# Patient Record
Sex: Female | Born: 1990 | Race: Black or African American | Hispanic: No | Marital: Single | Smoking: Never smoker
Health system: Southern US, Community
[De-identification: ages and names within clinical notes are randomized; demographics above are authoritative.]

## PROBLEM LIST (undated history)

## (undated) ENCOUNTER — Inpatient Hospital Stay (HOSPITAL_COMMUNITY): Payer: Self-pay

## (undated) HISTORY — PX: TONSILLECTOMY: SUR1361

---

## 2013-02-24 ENCOUNTER — Encounter (HOSPITAL_COMMUNITY): Payer: Self-pay

## 2013-02-24 DIAGNOSIS — R1011 Right upper quadrant pain: Secondary | ICD-10-CM | POA: Insufficient documentation

## 2013-02-24 DIAGNOSIS — Z79899 Other long term (current) drug therapy: Secondary | ICD-10-CM | POA: Insufficient documentation

## 2013-02-24 DIAGNOSIS — Z3202 Encounter for pregnancy test, result negative: Secondary | ICD-10-CM | POA: Insufficient documentation

## 2013-02-24 DIAGNOSIS — E876 Hypokalemia: Secondary | ICD-10-CM | POA: Insufficient documentation

## 2013-02-24 DIAGNOSIS — Z87891 Personal history of nicotine dependence: Secondary | ICD-10-CM | POA: Insufficient documentation

## 2013-02-24 LAB — COMPREHENSIVE METABOLIC PANEL
AST: 15 U/L (ref 0–37)
Albumin: 3.7 g/dL (ref 3.5–5.2)
BUN: 10 mg/dL (ref 6–23)
Calcium: 9.2 mg/dL (ref 8.4–10.5)
Chloride: 100 mEq/L (ref 96–112)
Creatinine, Ser: 0.8 mg/dL (ref 0.50–1.10)
Total Bilirubin: 0.2 mg/dL — ABNORMAL LOW (ref 0.3–1.2)

## 2013-02-24 LAB — CBC WITH DIFFERENTIAL/PLATELET
Basophils Absolute: 0 10*3/uL (ref 0.0–0.1)
Basophils Relative: 1 % (ref 0–1)
Eosinophils Absolute: 0.1 10*3/uL (ref 0.0–0.7)
Eosinophils Relative: 1 % (ref 0–5)
HCT: 41.7 % (ref 36.0–46.0)
Hemoglobin: 13.9 g/dL (ref 12.0–15.0)
MCH: 28.4 pg (ref 26.0–34.0)
MCHC: 33.3 g/dL (ref 30.0–36.0)
MCV: 85.1 fL (ref 78.0–100.0)
Monocytes Absolute: 0.8 10*3/uL (ref 0.1–1.0)
Monocytes Relative: 10 % (ref 3–12)
Neutro Abs: 4.5 10*3/uL (ref 1.7–7.7)
RDW: 13 % (ref 11.5–15.5)

## 2013-02-24 LAB — URINALYSIS, ROUTINE W REFLEX MICROSCOPIC
Ketones, ur: NEGATIVE mg/dL
Leukocytes, UA: NEGATIVE
Nitrite: NEGATIVE
Protein, ur: NEGATIVE mg/dL
pH: 5 (ref 5.0–8.0)

## 2013-02-24 LAB — POCT PREGNANCY, URINE: Preg Test, Ur: NEGATIVE

## 2013-02-24 NOTE — ED Notes (Signed)
NURSE FIRST ROUNDS :  NURSE EXPLAINED DELAY , WAIT TIME AND PROCESS , RESPIRATIONS UNLABORED , AMBULATORY ,  VITAL SIGNS CHECKED.

## 2013-02-24 NOTE — ED Notes (Signed)
Pt c/o RUQ pain x1 week. Pt denies N/V/D, abnormal vaginal odor or discharge, or burning w/urination

## 2013-02-25 ENCOUNTER — Encounter (HOSPITAL_COMMUNITY): Payer: Self-pay

## 2013-02-25 ENCOUNTER — Emergency Department (HOSPITAL_COMMUNITY): Payer: BC Managed Care – PPO

## 2013-02-25 ENCOUNTER — Emergency Department (HOSPITAL_COMMUNITY)
Admission: EM | Admit: 2013-02-25 | Discharge: 2013-02-25 | Disposition: A | Discharge status: Home / Self Care | Payer: BC Managed Care – PPO | Attending: Emergency Medicine | Admitting: Emergency Medicine

## 2013-02-25 DIAGNOSIS — R1011 Right upper quadrant pain: Secondary | ICD-10-CM

## 2013-02-25 DIAGNOSIS — E876 Hypokalemia: Secondary | ICD-10-CM

## 2013-02-25 LAB — LIPASE, BLOOD: Lipase: 24 U/L (ref 11–59)

## 2013-02-25 MED ORDER — KETOROLAC TROMETHAMINE 30 MG/ML IJ SOLN
30.0000 mg | Freq: Once | INTRAMUSCULAR | Status: AC
  Administered 2013-02-25: 30 mg via INTRAVENOUS
  Filled 2013-02-25: qty 1

## 2013-02-25 MED ORDER — IOHEXOL 350 MG/ML SOLN
100.0000 mL | Freq: Once | INTRAVENOUS | Status: AC | PRN
  Administered 2013-02-25: 100 mL via INTRAVENOUS

## 2013-02-25 MED ORDER — SODIUM CHLORIDE 0.9 % IV SOLN
INTRAVENOUS | Status: DC
  Administered 2013-02-25: 06:00:00 via INTRAVENOUS

## 2013-02-25 MED ORDER — ONDANSETRON 4 MG PO TBDP
8.0000 mg | ORAL_TABLET | Freq: Once | ORAL | Status: AC
  Administered 2013-02-25: 8 mg via ORAL
  Filled 2013-02-25: qty 2

## 2013-02-25 MED ORDER — MORPHINE SULFATE 4 MG/ML IJ SOLN
4.0000 mg | Freq: Once | INTRAMUSCULAR | Status: AC
  Administered 2013-02-25: 4 mg via INTRAMUSCULAR
  Filled 2013-02-25: qty 1

## 2013-02-25 MED ORDER — ONDANSETRON 4 MG PO TBDP: 4.0000 mg | ORAL_TABLET | Freq: Three times a day (TID) | ORAL | Status: DC | PRN

## 2013-02-25 MED ORDER — POTASSIUM CHLORIDE ER 20 MEQ PO TBCR: 20.0000 meq | EXTENDED_RELEASE_TABLET | Freq: Two times a day (BID) | ORAL | Status: DC

## 2013-02-25 MED ORDER — HYDROCODONE-ACETAMINOPHEN 5-325 MG PO TABS: ORAL_TABLET | ORAL | Status: DC

## 2013-02-25 NOTE — ED Provider Notes (Signed)
History     CSN: [id number]  Arrival date & time 02/24/13  2101   First MD Initiated Contact with Patient 02/25/13 [phone]      Chief Complaint  Patient presents with  . Abdominal Pain    (Consider location/radiation/quality/duration/timing/severity/associated sxs/prior treatment) HPI Patient reports she's had some right upper quadrant pain off and on for over a week. She describes it as a sharp pain. She does not related to food. She states it does not radiate. She denies nausea, vomiting, diarrhea, fever, cough, or shortness of breath. She states that when she has the pain coughing or deep breathing makes it worse. She states the pain only lasted a few minutes and it occurs 2-3 times a day. She states it's worse at night. She denies any urinary symptoms such as dysuria or frequency. She states when she was in high school she was evaluated for gallstones that was negative. She does not recall what that pain was like. She relates today her pain started at 9 PM and continues. She relates her pain was a 10 out of 10 and currently is a 7/10.  PCP out-of-town  History reviewed. No pertinent past medical history.  History reviewed. No pertinent past surgical history.  History reviewed. No pertinent family history.  History  Substance Use Topics  . Smoking status: Former Games developer  . Smokeless tobacco: Not on file  . Alcohol Use: No   college student Occasional alcohol  OB History   Grav Para Term Preterm Abortions TAB SAB Ect Mult Living                  Review of Systems  All other systems reviewed and are negative.    Allergies  Review of patient's allergies indicates no known allergies.  Home Medications   Current Outpatient Rx  Name  Route  Sig  Dispense  Refill  . HYDROcodone-acetaminophen (NORCO/VICODIN) 5-325 MG per tablet      Take 1 or 2 po Q 6hrs for pain   10 tablet   0   . ondansetron (ZOFRAN ODT) 4 MG disintegrating tablet   Oral   Take 1 tablet (4 mg  total) by mouth every 8 (eight) hours as needed for nausea.   6 tablet   0   . potassium chloride 20 MEQ TBCR   Oral   Take 20 mEq by mouth 2 (two) times daily.   8 tablet   0    Implanon  BP 111/75  Pulse 78  Temp(Src) 99.2 F (37.3 C) (Oral)  Resp 16  SpO2 98%  LMP 02/10/2013  Vital signs normal    Physical Exam  Nursing note and vitals reviewed. Constitutional: She is oriented to person, place, and time. She appears well-developed and well-nourished.  Non-toxic appearance. She does not appear ill. No distress.  HENT:  Head: Normocephalic and atraumatic.  Right Ear: External ear normal.  Left Ear: External ear normal.  Nose: Nose normal. No mucosal edema or rhinorrhea.  Mouth/Throat: Oropharynx is clear and moist and mucous membranes are normal. No dental abscesses or edematous.  Eyes: Conjunctivae and EOM are normal. Pupils are equal, round, and reactive to light.  Neck: Normal range of motion and full passive range of motion without pain. Neck supple.  Cardiovascular: Normal rate, regular rhythm and normal heart sounds.  Exam reveals no gallop and no friction rub.   No murmur heard. Pulmonary/Chest: Effort normal and breath sounds normal. No respiratory distress. She has no wheezes. She has  no rhonchi. She has no rales. She exhibits no tenderness and no crepitus.  Abdominal: Soft. Normal appearance and bowel sounds are normal. She exhibits no distension. There is tenderness. There is no rebound and no guarding.    Musculoskeletal: Normal range of motion. She exhibits no edema and no tenderness.  Moves all extremities well.   Neurological: She is alert and oriented to person, place, and time. She has normal strength. No cranial nerve deficit.  Skin: Skin is warm, dry and intact. No rash noted. No erythema. No pallor.  Psychiatric: She has a normal mood and affect. Her speech is normal and behavior is normal. Her mood appears not anxious.    ED Course  Procedures  (including critical care time)  Medications  0.9 %  sodium chloride infusion ( Intravenous New Bag/Given 02/25/13 0608)  morphine 4 MG/ML injection 4 mg (4 mg Intramuscular Given 02/25/13 0158)  ondansetron (ZOFRAN-ODT) disintegrating tablet 8 mg (8 mg Oral Given 02/25/13 0157)  ketorolac (TORADOL) 30 MG/ML injection 30 mg (30 mg Intravenous Given 02/25/13 0608)  iohexol (OMNIPAQUE) 350 MG/ML injection 100 mL (100 mLs Intravenous Contrast Given 02/25/13 0634)   Patient's pain improved during her ED visit. She did tell the nurse she was having some lower abdominal pain but that was only when she had to urinate. She states that pain is gone. Patient's pain is classic for gallbladder however her ultrasound is normal. We discussed she needed to have further evaluation such as a HIDA scan however she has no local doctor to followup with. 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  Results for orders placed during the hospital encounter of 02/25/13  CBC WITH DIFFERENTIAL      Result Value Range   WBC 7.7  4.0 - 10.5 K/uL   RBC 4.90  3.87 - 5.11 MIL/uL   Hemoglobin 13.9  12.0 - 15.0 g/dL   HCT 40.9  81.1 - 91.4 %   MCV 85.1  78.0 - 100.0 fL   MCH 28.4  26.0 - 34.0 pg   MCHC 33.3  30.0 - 36.0 g/dL   RDW 78.2  95.6 - 21.3 %   Platelets 324  150 - 400 K/uL   Neutrophils Relative % 58  43 - 77 %   Neutro Abs 4.5  1.7 - 7.7 K/uL   Lymphocytes Relative 31  12 - 46 %   Lymphs Abs 2.4  0.7 - 4.0 K/uL   Monocytes Relative 10  3 - 12 %   Monocytes Absolute 0.8  0.1 - 1.0 K/uL   Eosinophils Relative 1  0 - 5 %   Eosinophils Absolute 0.1  0.0 - 0.7 K/uL   Basophils Relative 1  0 - 1 %   Basophils Absolute 0.0  0.0 - 0.1 K/uL  COMPREHENSIVE METABOLIC PANEL      Result Value Range   Sodium 136  135 - 145 mEq/L   Potassium 3.2 (*) 3.5 - 5.1 mEq/L   Chloride 100  96 - 112 mEq/L   CO2 26  19 - 32 mEq/L   Glucose, Bld 67 (*) 70 - 99 mg/dL   BUN 10  6 - 23  mg/dL   Creatinine, Ser 0.86  0.50 - 1.10 mg/dL   Calcium 9.2  8.4 - 57.8 mg/dL   Total Protein 7.6  6.0 - 8.3 g/dL   Albumin 3.7  3.5 - 5.2 g/dL   AST 15  0 -  37 U/L   ALT 6  0 - 35 U/L   Alkaline Phosphatase 49  39 - 117 U/L   Total Bilirubin 0.2 (*) 0.3 - 1.2 mg/dL   GFR calc non Af Amer >90  >90 mL/min   GFR calc Af Amer >90  >90 mL/min  URINALYSIS, ROUTINE W REFLEX MICROSCOPIC      Result Value Range   Color, Urine YELLOW  YELLOW   APPearance HAZY (*) CLEAR   Specific Gravity, Urine 1.035 (*) 1.005 - 1.030   pH 5.0  5.0 - 8.0   Glucose, UA NEGATIVE  NEGATIVE mg/dL   Hgb urine dipstick NEGATIVE  NEGATIVE   Bilirubin Urine SMALL (*) NEGATIVE   Ketones, ur NEGATIVE  NEGATIVE mg/dL   Protein, ur NEGATIVE  NEGATIVE mg/dL   Urobilinogen, UA 0.2  0.0 - 1.0 mg/dL   Nitrite NEGATIVE  NEGATIVE   Leukocytes, UA NEGATIVE  NEGATIVE  LIPASE, BLOOD      Result Value Range   Lipase 24  11 - 59 U/L  D-DIMER, QUANTITATIVE      Result Value Range   D-Dimer, Quant 0.80 (*) 0.00 - 0.48 ug/mL-FEU  POCT PREGNANCY, URINE      Result Value Range   Preg Test, Ur NEGATIVE  NEGATIVE   Laboratory interpretation all normal except elevated ddimer, hypokalemia    Ct Angio Chest W/cm &/or Wo Cm  02/25/2013   *RADIOLOGY REPORT*  Clinical Data: Chest pain for 1 week, worsening.  CT ANGIOGRAPHY CHEST  Technique:  Multidetector CT imaging of the chest using the standard protocol during bolus administration of intravenous contrast. Multiplanar reconstructed images including MIPs were obtained and reviewed to evaluate the vascular anatomy.  Contrast: OMNIPAQUE IOHEXOL 350 MG/ML SOLN  Comparison: None.  Findings: No pulmonary embolus is identified.  Heart size is normal.  There is no pleural or pericardial effusion.  No axillary, hilar or mediastinal lymphadenopathy is identified.  The lungs are clear.  Visualized intra-abdominal contents and appear normal.  No bony abnormality is identified.  IMPRESSION:  Negative for pulmonary embolus.  Negative examination.   Original Report Authenticated By: Holley Dexter, M.D.   US Abdomen Complete  02/25/2013   *RADIOLOGY REPORT*  Clinical Data:  Right upper quadrant abdominal pain.  ABDOMINAL ULTRASOUND COMPLETE  Comparison:  None  Findings:  Gallbladder:  The gallbladder is normal in appearance, without evidence for gallstones, gallbladder wall thickening or pericholecystic fluid.  No ultrasonographic Murphy's sign is elicited.  Common Bile Duct:  0.3 cm in diameter; within normal limits in caliber.  Liver:  Normal parenchymal echogenicity and echotexture; no focal lesions identified.  Limited Doppler evaluation demonstrates normal blood flow within the liver.  IVC:  Unremarkable in appearance.  Pancreas:  Although the pancreas is difficult to visualize in its entirety due to overlying bowel gas, no focal pancreatic abnormality is identified.  Spleen:  8.1 cm in length; within normal limits in size and echotexture.  Right kidney:  10.7 cm in length; normal in size, configuration and parenchymal echogenicity.  No evidence of mass or hydronephrosis.  Left kidney:  9.1 cm in length; normal in size, configuration and parenchymal echogenicity.  No evidence of mass or hydronephrosis.  Abdominal Aorta:  Normal in caliber; no aneurysm identified.  IMPRESSION: Unremarkable abdominal ultrasound.   Original Report Authenticated By: Tonia Ghent, M.D.     1. RUQ abdominal pain   2. Hypokalemia     New Prescriptions   HYDROCODONE-ACETAMINOPHEN (NORCO/VICODIN) 5-325 MG PER TABLET  Take 1 or 2 po Q 6hrs for pain   ONDANSETRON (ZOFRAN ODT) 4 MG DISINTEGRATING TABLET    Take 1 tablet (4 mg total) by mouth every 8 (eight) hours as needed for nausea.   POTASSIUM CHLORIDE 20 MEQ TBCR    Take 20 mEq by mouth 2 (two) times daily.    Plan discharge    Devoria Albe, MD, FACEP   MDM          Ward Givens, MD 02/25/13 [phone]

## 2013-02-25 NOTE — ED Notes (Signed)
Report received, assumed care.  

## 2016-06-20 ENCOUNTER — Ambulatory Visit (HOSPITAL_COMMUNITY)
Admission: EM | Admit: 2016-06-20 | Discharge: 2016-06-20 | Disposition: A | Discharge status: Home / Self Care | Payer: Self-pay | Attending: Internal Medicine | Admitting: Internal Medicine

## 2016-06-20 ENCOUNTER — Encounter (HOSPITAL_COMMUNITY): Payer: Self-pay | Admitting: Emergency Medicine

## 2016-06-20 DIAGNOSIS — A084 Viral intestinal infection, unspecified: Secondary | ICD-10-CM

## 2016-06-20 LAB — POCT PREGNANCY, URINE: Preg Test, Ur: NEGATIVE

## 2016-06-20 NOTE — ED Triage Notes (Signed)
Patient is not feeling well in general.

## 2016-06-20 NOTE — ED Provider Notes (Signed)
[facility]    CSN: [id number] Arrival date & time: 06/20/16  1954  First Provider Contact:  None       History   Chief Complaint No chief complaint on file.   HPI Whitney Fernandez is a 25 y.o. female.   C/o nausea and diarrhea x3 days.  Denies fever or abdominal pain.  No sick contacts      History reviewed. No pertinent past medical history.  There are no active problems to display for this patient.   History reviewed. No pertinent surgical history.  OB History    No data available       Home Medications    Prior to Admission medications   Medication Sig Start Date End Date Taking? Authorizing Provider  HYDROcodone-acetaminophen (NORCO/VICODIN) 5-325 MG per tablet Take 1 or 2 po Q 6hrs for pain 02/25/13   Devoria AlbeIva Knapp, MD  ondansetron (ZOFRAN ODT) 4 MG disintegrating tablet Take 1 tablet (4 mg total) by mouth every 8 (eight) hours as needed for nausea. 02/25/13   Devoria AlbeIva Knapp, MD  potassium chloride 20 MEQ TBCR Take 20 mEq by mouth 2 (two) times daily. 02/25/13   Devoria AlbeIva Knapp, MD    Family History History reviewed. No pertinent family history.  Social History Social History  Substance Use Topics  . Smoking status: Former Games developermoker  . Smokeless tobacco: Not on file  . Alcohol use No     Allergies   Review of patient's allergies indicates no known allergies.   Review of Systems Review of Systems  Constitutional: Negative for chills and fever.  HENT: Negative for ear pain and sore throat.   Eyes: Negative for pain and visual disturbance.  Respiratory: Negative for cough and shortness of breath.   Cardiovascular: Negative for chest pain and palpitations.  Gastrointestinal: Positive for diarrhea and nausea. Negative for abdominal pain and vomiting.  Genitourinary: Negative for dysuria and hematuria.  Musculoskeletal: Negative for arthralgias and back pain.  Skin: Negative for color change and rash.  Neurological: Negative for seizures and syncope.  All  other systems reviewed and are negative.    Physical Exam Triage Vital Signs ED Triage Vitals [06/20/16 2124]  Enc Vitals Group     BP 120/58     Pulse Rate (!) 57     Resp 12     Temp 98.5 F (36.9 C)     Temp Source Oral     SpO2 100 %     Weight      Height      Head Circumference      Peak Flow      Pain Score      Pain Loc      Pain Edu?      Excl. in GC?    No data found.   Updated Vital Signs BP 120/58 (BP Location: Left Arm)   Pulse (!) 57   Temp 98.5 F (36.9 C) (Oral)   Resp 12   SpO2 100%   Visual Acuity Right Eye Distance:   Left Eye Distance:   Bilateral Distance:    Right Eye Near:   Left Eye Near:    Bilateral Near:     Physical Exam  Constitutional: She is oriented to person, place, and time. She appears well-developed and well-nourished. No distress.  HENT:  Head: Normocephalic and atraumatic.  Mouth/Throat: Oropharynx is clear and moist.  Eyes: Conjunctivae and EOM are normal. Pupils are equal, round, and reactive to light. No scleral icterus.  Neck: Normal range of motion. Neck supple. No JVD present. No tracheal deviation present. No thyromegaly present.  Cardiovascular: Normal rate, regular rhythm and normal heart sounds.  Exam reveals no gallop and no friction rub.   No murmur heard. Pulmonary/Chest: Effort normal and breath sounds normal.  Abdominal: Soft. Bowel sounds are normal. She exhibits no distension. There is no tenderness.  Musculoskeletal: Normal range of motion. She exhibits no edema.  Lymphadenopathy:    She has no cervical adenopathy.  Neurological: She is alert and oriented to person, place, and time. No cranial nerve deficit.  Skin: Skin is warm and dry.  Psychiatric: She has a normal mood and affect. Her behavior is normal. Judgment and thought content normal.  Nursing note and vitals reviewed.    UC Treatments / Results  Labs (all labs ordered are listed, but only abnormal results are displayed) Labs Reviewed  - No data to display  EKG  EKG Interpretation None       Radiology No results found.  Procedures Procedures (including critical care time)  Medications Ordered in UC Medications - No data to display   Initial Impression / Assessment and Plan / UC Course  I have reviewed the triage vital signs and the nursing notes.  Pertinent labs & imaging results that were available during my care of the patient were reviewed by me and considered in my medical decision making (see chart for details).  Clinical Course    Likely viral.  Well hydrated. Symptomatic care  Final Clinical Impressions(s) / UC Diagnoses   Final diagnoses:  Viral gastroenteritis    New Prescriptions New Prescriptions   No medications on file     Arnaldo Natal, MD 06/20/16 2155

## 2016-10-17 ENCOUNTER — Encounter (HOSPITAL_COMMUNITY): Payer: Self-pay | Admitting: Emergency Medicine

## 2016-10-17 ENCOUNTER — Ambulatory Visit (HOSPITAL_COMMUNITY)
Admission: EM | Admit: 2016-10-17 | Discharge: 2016-10-17 | Disposition: A | Discharge status: Home / Self Care | Payer: Self-pay | Attending: Family Medicine | Admitting: Family Medicine

## 2016-10-17 DIAGNOSIS — S63501A Unspecified sprain of right wrist, initial encounter: Secondary | ICD-10-CM

## 2016-10-17 DIAGNOSIS — M659 Synovitis and tenosynovitis, unspecified: Secondary | ICD-10-CM

## 2016-10-17 MED ORDER — NAPROXEN 500 MG PO TABS: 500.0000 mg | ORAL_TABLET | Freq: Two times a day (BID) | ORAL | 0 refills | Status: DC

## 2016-10-17 NOTE — ED Triage Notes (Signed)
Pt started having pain in her right thumb about five days ago that radiates to her wrist.  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

## 2016-10-17 NOTE — ED Provider Notes (Signed)
CSN: [id number]     Arrival date & time 10/17/16  1205 History   None    Chief Complaint  Patient presents with  . Wrist Pain   (Consider location/radiation/quality/duration/timing/severity/associated sxs/prior Treatment) Patient c/o right thumb pain and discomfort.  She denies injury.  C/o overuse from job at [employer].   The history is provided by the patient.  Wrist Pain  This is a new problem. The current episode started more than 1 week ago. The problem occurs constantly. The problem has not changed since onset.   History reviewed. No pertinent past medical history. Past Surgical History:  Procedure Laterality Date  . TONSILLECTOMY     History reviewed. No pertinent family history. Social History  Substance Use Topics  . Smoking status: Former Smoker    Quit date: 10/16/2016  . Smokeless tobacco: Never Used  . Alcohol use Yes     Comment: occasional   OB History    No data available     Review of Systems  Constitutional: Negative.   HENT: Negative.   Eyes: Negative.   Respiratory: Negative.   Cardiovascular: Negative.   Gastrointestinal: Negative.   Endocrine: Negative.   Genitourinary: Negative.   Musculoskeletal: Positive for arthralgias.  Allergic/Immunologic: Negative.   Neurological: Negative.   Hematological: Negative.     Allergies  Patient has no known allergies.  Home Medications   Prior to Admission medications   Medication Sig Start Date End Date Taking? Authorizing Provider  HYDROcodone-acetaminophen (NORCO/VICODIN) 5-325 MG per tablet Take 1 or 2 po Q 6hrs for pain 02/25/13   Devoria AlbeIva Knapp, MD  naproxen (NAPROSYN) 500 MG tablet Take 1 tablet (500 mg total) by mouth 2 (two) times daily with a meal. 10/17/16   Deatra CanterWilliam J Axzel [place], FNP  ondansetron (ZOFRAN ODT) 4 MG disintegrating tablet Take 1 tablet (4 mg total) by mouth every 8 (eight) hours as needed for nausea. 02/25/13   Devoria AlbeIva Knapp, MD  potassium chloride 20 MEQ TBCR Take 20 mEq by mouth 2 (two)  times daily. 02/25/13   Devoria AlbeIva Knapp, MD   Meds Ordered and Administered this Visit  Medications - No data to display  BP 106/71 (BP Location: Left Arm)   Pulse 63   Temp 98.4 F (36.9 C) (Oral)   LMP 10/09/2016 (Exact Date)   SpO2 100%  No data found.   Physical Exam  Constitutional: She appears well-developed and well-nourished.  HENT:  Head: Normocephalic and atraumatic.  Eyes: EOM are normal. Pupils are equal, round, and reactive to light.  Neck: Normal range of motion. Neck supple.  Cardiovascular: Normal rate, regular rhythm and normal heart sounds.   Pulmonary/Chest: Effort normal and breath sounds normal.  Musculoskeletal: She exhibits tenderness.  Positive Finklestein right wrist/thumb  Nursing note and vitals reviewed.   Urgent Care Course   Clinical Course     Procedures (including critical care time)  Labs Review Labs Reviewed - No data to display  Imaging Review No results found.   Visual Acuity Review  Right Eye Distance:   Left Eye Distance:   Bilateral Distance:    Right Eye Near:   Left Eye Near:    Bilateral Near:         MDM   1. Tenosynovitis   2. Sprain of right wrist, initial encounter    Naprosyn 500mg  one po bid x 10 days  Spica Splint right thumb      Deatra CanterWilliam J Mandela Bello, FNP 10/17/16 1313

## 2017-03-26 ENCOUNTER — Ambulatory Visit: Payer: Self-pay

## 2017-03-26 ENCOUNTER — Encounter: Payer: Self-pay | Admitting: Family Medicine

## 2017-03-26 LAB — POCT PREGNANCY, URINE
Preg Test, Ur: POSITIVE — AB
Preg Test, Ur: POSITIVE — AB

## 2017-03-30 ENCOUNTER — Inpatient Hospital Stay (HOSPITAL_COMMUNITY)
Admission: AD | Admit: 2017-03-30 | Discharge: 2017-03-30 | Disposition: A | Discharge status: Home / Self Care | Payer: Medicaid Other | Source: Ambulatory Visit | Attending: Obstetrics and Gynecology | Admitting: Obstetrics and Gynecology

## 2017-03-30 ENCOUNTER — Encounter: Payer: Self-pay | Admitting: Student

## 2017-03-30 ENCOUNTER — Inpatient Hospital Stay (HOSPITAL_COMMUNITY): Payer: Medicaid Other

## 2017-03-30 DIAGNOSIS — O3481 Maternal care for other abnormalities of pelvic organs, first trimester: Secondary | ICD-10-CM | POA: Insufficient documentation

## 2017-03-30 DIAGNOSIS — N83209 Unspecified ovarian cyst, unspecified side: Secondary | ICD-10-CM | POA: Diagnosis not present

## 2017-03-30 DIAGNOSIS — O26891 Other specified pregnancy related conditions, first trimester: Secondary | ICD-10-CM | POA: Insufficient documentation

## 2017-03-30 DIAGNOSIS — O9989 Other specified diseases and conditions complicating pregnancy, childbirth and the puerperium: Secondary | ICD-10-CM

## 2017-03-30 DIAGNOSIS — K5900 Constipation, unspecified: Secondary | ICD-10-CM

## 2017-03-30 DIAGNOSIS — Z3201 Encounter for pregnancy test, result positive: Secondary | ICD-10-CM | POA: Diagnosis not present

## 2017-03-30 DIAGNOSIS — O26899 Other specified pregnancy related conditions, unspecified trimester: Secondary | ICD-10-CM

## 2017-03-30 DIAGNOSIS — O99611 Diseases of the digestive system complicating pregnancy, first trimester: Secondary | ICD-10-CM | POA: Diagnosis not present

## 2017-03-30 DIAGNOSIS — Z9889 Other specified postprocedural states: Secondary | ICD-10-CM | POA: Insufficient documentation

## 2017-03-30 DIAGNOSIS — Z3A01 Less than 8 weeks gestation of pregnancy: Secondary | ICD-10-CM | POA: Diagnosis not present

## 2017-03-30 DIAGNOSIS — Z3491 Encounter for supervision of normal pregnancy, unspecified, first trimester: Secondary | ICD-10-CM

## 2017-03-30 DIAGNOSIS — R109 Unspecified abdominal pain: Secondary | ICD-10-CM | POA: Diagnosis present

## 2017-03-30 LAB — URINALYSIS, ROUTINE W REFLEX MICROSCOPIC
Bilirubin Urine: NEGATIVE
Glucose, UA: NEGATIVE mg/dL
Ketones, ur: NEGATIVE mg/dL
Nitrite: NEGATIVE
PROTEIN: 100 mg/dL — AB
SPECIFIC GRAVITY, URINE: 1.014 (ref 1.005–1.030)
pH: 5 (ref 5.0–8.0)

## 2017-03-30 LAB — CBC
HCT: 37 % (ref 36.0–46.0)
HEMOGLOBIN: 12.5 g/dL (ref 12.0–15.0)
MCH: 28.4 pg (ref 26.0–34.0)
MCHC: 33.8 g/dL (ref 30.0–36.0)
MCV: 84.1 fL (ref 78.0–100.0)
Platelets: 361 10*3/uL (ref 150–400)
RBC: 4.4 MIL/uL (ref 3.87–5.11)
RDW: 13.5 % (ref 11.5–15.5)
WBC: 8.9 10*3/uL (ref 4.0–10.5)

## 2017-03-30 LAB — WET PREP, GENITAL
CLUE CELLS WET PREP: NONE SEEN
Sperm: NONE SEEN
TRICH WET PREP: NONE SEEN
YEAST WET PREP: NONE SEEN

## 2017-03-30 LAB — HCG, QUANTITATIVE, PREGNANCY: HCG, BETA CHAIN, QUANT, S: 48047 m[IU]/mL — AB (ref <5)

## 2017-03-30 LAB — ABO/RH: ABO/RH(D): O POS

## 2017-03-30 NOTE — Discharge Instructions (Signed)
Safe Medications in Pregnancy   Acne: Benzoyl Peroxide Salicylic Acid  Backache/Headache: Tylenol: 2 regular strength every 4 hours OR              2 Extra strength every 6 hours  Colds/Coughs/Allergies: Benadryl (alcohol free) 25 mg every 6 hours as needed Breath right strips Claritin Cepacol throat lozenges Chloraseptic throat spray Cold-Eeze- up to three times per day Cough drops, alcohol free Flonase (by prescription only) Guaifenesin Mucinex Robitussin DM (plain only, alcohol free) Saline nasal spray/drops Sudafed (pseudoephedrine) & Actifed ** use only after [redacted] weeks gestation and if you do not have high blood pressure Tylenol Vicks Vaporub Zinc lozenges Zyrtec   Constipation: Colace Ducolax suppositories Fleet enema Glycerin suppositories Metamucil Milk of magnesia Miralax Senokot Smooth move tea  Diarrhea: Kaopectate Imodium A-D  *NO pepto Bismol  Hemorrhoids: Anusol Anusol HC Preparation H Tucks  Indigestion: Tums Maalox Mylanta Zantac  Pepcid  Insomnia: Benadryl (alcohol free) 25mg  every 6 hours as needed Tylenol PM Unisom, no Gelcaps  Leg Cramps: Tums MagGel  Nausea/Vomiting:  Bonine Dramamine Emetrol Ginger extract Sea bands Meclizine  Nausea medication to take during pregnancy:  Unisom (doxylamine succinate 25 mg tablets) Take one tablet daily at bedtime. If symptoms are not adequately controlled, the dose can be increased to a maximum recommended dose of two tablets daily (1/2 tablet in the morning, 1/2 tablet mid-afternoon and one at bedtime). Vitamin B6 100mg  tablets. Take one tablet twice a day (up to 200 mg per day).  Skin Rashes: Aveeno products Benadryl cream or 25mg  every 6 hours as needed Calamine Lotion 1% cortisone cream  Yeast infection: Gyne-lotrimin 7 Monistat 7  Gum/tooth pain: Anbesol  **If taking multiple medications, please check labels to avoid duplicating the same active ingredients **take  medication as directed on the label ** Do not exceed 4000 mg of tylenol in 24 hours **Do not take medications that contain aspirin or ibuprofen     Constipation, Adult Constipation is when a person has fewer bowel movements in a week than normal, has difficulty having a bowel movement, or has stools that are dry, hard, or larger than normal. Constipation may be caused by an underlying condition. It may become worse with age if a person takes certain medicines and does not take in enough fluids. Follow these instructions at home: Eating and drinking   Eat foods that have a lot of fiber, such as fresh fruits and vegetables, whole grains, and beans.  Limit foods that are high in fat, low in fiber, or overly processed, such as french fries, hamburgers, cookies, candies, and soda.  Drink enough fluid to keep your urine clear or pale yellow. General instructions  Exercise regularly or as told by your health care provider.  Go to the restroom when you have the urge to go. Do not hold it in.  Take over-the-counter and prescription medicines only as told by your health care provider. These include any fiber supplements.  Practice pelvic floor retraining exercises, such as deep breathing while relaxing the lower abdomen and pelvic floor relaxation during bowel movements.  Watch your condition for any changes.  Keep all follow-up visits as told by your health care provider. This is important. Contact a health care provider if:  You have pain that gets worse.  You have a fever.  You do not have a bowel movement after 4 days.  You vomit.  You are not hungry.  You lose weight.  You are bleeding from the anus.  You have thin, pencil-like stools. Get help right away if:  You have a fever and your symptoms suddenly get worse.  You leak stool or have blood in your stool.  Your abdomen is bloated.  You have severe pain in your abdomen.  You feel dizzy or you faint. This  information is not intended to replace advice given to you by your health care provider. Make sure you discuss any questions you have with your health care provider. Document Released: 06/16/2004 Document Revised: 04/07/2016 Document Reviewed: 03/08/2016 Elsevier Interactive Patient Education  2017 [employer]

## 2017-03-30 NOTE — MAU Provider Note (Signed)
History     CSN: [id number]  Arrival date and time: 03/30/17 1120  First Provider Initiated Contact with Patient 03/30/17 1159   Chief Complaint  Patient presents with  . Abdominal Pain   HPI Whitney Fernandez is a 26 y.o. G1P0 at [redacted]w[redacted]d by LMP who presents with abdominal pain. Symptoms began 5 days ago. Abdominal pain is intermittent & alternates sides. Describes as menstrual like cramps. Rates pain 7/10 when is occurs. Denies n/v/d, vaginal discharge, vaginal bleeding, or dysuria. Endorses constipation. Last BM was yesterday; small amount of hard stool. Took dose of stool softener 2 days ago.   OB History    Gravida Para Term Preterm AB Living   1             SAB TAB Ectopic Multiple Live Births                  History reviewed. No pertinent past medical history.  Past Surgical History:  Procedure Laterality Date  . TONSILLECTOMY      History reviewed. No pertinent family history.  Social History  Substance Use Topics  . Smoking status: Never Smoker  . Smokeless tobacco: Never Used  . Alcohol use No     Comment: occasional    Allergies: No Known Allergies  Prescriptions Prior to Admission  Medication Sig Dispense Refill Last Dose  . docusate sodium (COLACE) 100 MG capsule Take 100 mg by mouth daily as needed for mild constipation.   Past Week at Unknown time  . Prenatal Vit-Fe Phos-FA-Omega (VITAFOL GUMMIES) 3.33-0.333-34.8 MG CHEW Chew 2 tablets by mouth daily.   03/30/2017 at Unknown time  . naproxen (NAPROSYN) 500 MG tablet Take 1 tablet (500 mg total) by mouth 2 (two) times daily with a meal. 20 tablet 0     Review of Systems  Constitutional: Negative.   Gastrointestinal: Positive for abdominal pain and constipation. Negative for diarrhea, nausea and vomiting.  Genitourinary: Negative.    Physical Exam   Blood pressure 112/82, pulse 69, temperature 98.4 F (36.9 C), temperature source Oral, resp. rate 16, height 5\' 3"  (1.6 m), weight 140 lb 8 oz (63.7 kg), last  menstrual period 02/16/2017, SpO2 99 %.  Physical Exam  Nursing note and vitals reviewed. Constitutional: She is oriented to person, place, and time. She appears well-developed and well-nourished. No distress.  HENT:  Head: Normocephalic and atraumatic.  Eyes: Conjunctivae are normal. Right eye exhibits no discharge. Left eye exhibits no discharge. No scleral icterus.  Neck: Normal range of motion.  Cardiovascular: Normal rate, regular rhythm and normal heart sounds.   No murmur heard. Respiratory: Effort normal and breath sounds normal. No respiratory distress. She has no wheezes.  GI: Soft. Bowel sounds are normal. She exhibits no distension. There is no tenderness. There is no rebound.  Neurological: She is alert and oriented to person, place, and time.  Skin: Skin is warm and dry. She is not diaphoretic.  Psychiatric: She has a normal mood and affect. Her behavior is normal. Judgment and thought content normal.    MAU Course  Procedures Results for orders placed or performed during the hospital encounter of 03/30/17 (from the past 24 hour(s))  Urinalysis, Routine w reflex microscopic     Status: Abnormal   Collection Time: 03/30/17 11:26 AM  Result Value Ref Range   Color, Urine YELLOW YELLOW   APPearance CLOUDY (A) CLEAR   Specific Gravity, Urine 1.014 1.005 - 1.030   pH 5.0 5.0 - 8.0  Glucose, UA NEGATIVE NEGATIVE mg/dL   Hgb urine dipstick MODERATE (A) NEGATIVE   Bilirubin Urine NEGATIVE NEGATIVE   Ketones, ur NEGATIVE NEGATIVE mg/dL   Protein, ur 161100 (A) NEGATIVE mg/dL   Nitrite NEGATIVE NEGATIVE   Leukocytes, UA LARGE (A) NEGATIVE   RBC / HPF TOO NUMEROUS TO COUNT 0 - 5 RBC/hpf   WBC, UA TOO NUMEROUS TO COUNT 0 - 5 WBC/hpf   Bacteria, UA RARE (A) NONE SEEN   Squamous Epithelial / LPF 0-5 (A) NONE SEEN   WBC Clumps PRESENT    Mucous PRESENT    Hyaline Casts, UA PRESENT   CBC     Status: None   Collection Time: 03/30/17 12:49 PM  Result Value Ref Range   WBC 8.9  4.0 - 10.5 K/uL   RBC 4.40 3.87 - 5.11 MIL/uL   Hemoglobin 12.5 12.0 - 15.0 g/dL   HCT 09.637.0 04.536.0 - 40.946.0 %   MCV 84.1 78.0 - 100.0 fL   MCH 28.4 26.0 - 34.0 pg   MCHC 33.8 30.0 - 36.0 g/dL   RDW 81.113.5 91.411.5 - 78.215.5 %   Platelets 361 150 - 400 K/uL  ABO/Rh     Status: None (Preliminary result)   Collection Time: 03/30/17 12:49 PM  Result Value Ref Range   ABO/RH(D) O POS   hCG, quantitative, pregnancy     Status: Abnormal   Collection Time: 03/30/17 12:49 PM  Result Value Ref Range   hCG, Beta Chain, Quant, S 48,047 (H) <5 mIU/mL  Wet prep, genital     Status: Abnormal   Collection Time: 03/30/17  1:00 PM  Result Value Ref Range   Yeast Wet Prep HPF POC NONE SEEN NONE SEEN   Trich, Wet Prep NONE SEEN NONE SEEN   Clue Cells Wet Prep HPF POC NONE SEEN NONE SEEN   WBC, Wet Prep HPF POC FEW (A) NONE SEEN   Sperm NONE SEEN    [place] Ob Comp Less 14 Wks  Result Date: 03/30/2017 CLINICAL DATA:  Abdominal pain affecting pregnancy EXAM: OBSTETRIC <14 WK US AND TRANSVAGINAL OB US TECHNIQUE: Both transabdominal and transvaginal ultrasound examinations were performed for complete evaluation of the gestation as well as the maternal uterus, adnexal regions, and pelvic cul-de-sac. Transvaginal technique was performed to assess early pregnancy. COMPARISON:  None for this gestation FINDINGS: Intrauterine gestational sac: Present Yolk sac:  Present Embryo:  Present Cardiac Activity: Present Heart Rate: 136  bpm CRL:  4.6  mm   6 w   1 d                  US EDC: 11/19/2015 Subchorionic hemorrhage:  None visualized. Maternal uterus/adnexae: LEFT ovary normal size and morphology, 3.1 x 1.7 x 2.5 cm. RIGHT ovary enlarged at 6.2 x 5.4 x 4.9 cm, containing a large septated cyst. 4.8 x 4.4 x 5.8 cm. Trace free pelvic fluid. No additional adnexal masses. IMPRESSION: Single live intrauterine gestation at 6 weeks 1 day EGA by crown-rump length. Large septated cyst RIGHT ovary 5.8 cm greatest size. Electronically Signed   By:  Ulyses SouthwardMark  Boles M.D.   On: 03/30/2017 13:55   [place] Ob Transvaginal  Result Date: 03/30/2017 CLINICAL DATA:  Abdominal pain affecting pregnancy EXAM: OBSTETRIC <14 WK US AND TRANSVAGINAL OB US TECHNIQUE: Both transabdominal and transvaginal ultrasound examinations were performed for complete evaluation of the gestation as well as the maternal uterus, adnexal regions, and pelvic cul-de-sac. Transvaginal technique was performed to assess early pregnancy. COMPARISON:  None for this gestation FINDINGS: Intrauterine gestational sac: Present Yolk sac:  Present Embryo:  Present Cardiac Activity: Present Heart Rate: 136  bpm CRL:  4.6  mm   6 w   1 d                  [place] EDC: 11/19/2015 Subchorionic hemorrhage:  None visualized. Maternal uterus/adnexae: LEFT ovary normal size and morphology, 3.1 x 1.7 x 2.5 cm. RIGHT ovary enlarged at 6.2 x 5.4 x 4.9 cm, containing a large septated cyst. 4.8 x 4.4 x 5.8 cm. Trace free pelvic fluid. No additional adnexal masses. IMPRESSION: Single live intrauterine gestation at 6 weeks 1 day EGA by crown-rump length. Large septated cyst RIGHT ovary 5.8 cm greatest size. Electronically Signed   By: Ulyses Southward M.D.   On: 03/30/2017 13:55    MDM +UPT UA, wet prep, GC/chlamydia, CBC, ABO/Rh, quant hCG, HIV, and [place] today to rule out ectopic pregnancy O positive Ultrasound shows SIUP with cardiac activity. Right ovarian cyst, 5.8 cm C/w Dr. Macon Large. Cyst likely CLC. Pt in no distress and VSS. Will have f/u for prenatal care & give torsion precautions.  Assessment and Plan  A: 1. Normal IUP (intrauterine pregnancy) on prenatal ultrasound, first trimester   2. Abdominal pain affecting pregnancy   3. Constipation during pregnancy in first trimester   4. Ovarian cyst during pregnancy in first trimester    P: Discharge home Continue taking stool softeners per package instructions Discussed at home tx of constipation Discussed reasons to return to [facility] Start prenatal care -- plans on  going to [facility] GC/CT & urine culture pending   Whitney Fernandez 03/30/2017, 11:59 AM

## 2017-03-30 NOTE — MAU Note (Signed)
Patient was seen in women's clinic on Monday and had a positive pregnancy test.  She has had intermittent abdominal pain for past 3 days that has varied from left to right sides.  She denies vaginal bleeding or discharge.  Also states she is having irregular BMs with a small BM today.  Reports taking a stool softener today to aid with constipation.

## 2017-03-31 LAB — HIV ANTIBODY (ROUTINE TESTING W REFLEX): HIV SCREEN 4TH GENERATION: NONREACTIVE

## 2017-04-01 LAB — CULTURE, OB URINE: Culture: 80000 — AB

## 2017-04-02 ENCOUNTER — Other Ambulatory Visit: Payer: Self-pay | Admitting: Advanced Practice Midwife

## 2017-04-02 LAB — GC/CHLAMYDIA PROBE AMP (~~LOC~~) NOT AT ARMC
CHLAMYDIA, DNA PROBE: NEGATIVE
Neisseria Gonorrhea: NEGATIVE

## 2017-04-02 MED ORDER — CLINDAMYCIN HCL 300 MG PO CAPS: 300.0000 mg | ORAL_CAPSULE | Freq: Three times a day (TID) | ORAL | 0 refills | Status: AC

## 2017-04-02 NOTE — Progress Notes (Signed)
[facility] to [facility]  Rx sent to pharmacy  Patient notified

## 2017-05-03 ENCOUNTER — Ambulatory Visit (INDEPENDENT_AMBULATORY_CARE_PROVIDER_SITE_OTHER): Payer: Medicaid Other | Admitting: Obstetrics and Gynecology

## 2017-05-03 ENCOUNTER — Encounter: Payer: Self-pay | Admitting: Obstetrics and Gynecology

## 2017-05-03 DIAGNOSIS — Z3401 Encounter for supervision of normal first pregnancy, first trimester: Secondary | ICD-10-CM | POA: Diagnosis not present

## 2017-05-03 DIAGNOSIS — Z3400 Encounter for supervision of normal first pregnancy, unspecified trimester: Secondary | ICD-10-CM | POA: Insufficient documentation

## 2017-05-03 NOTE — Progress Notes (Signed)
Patient presents for NOB visit. 

## 2017-05-03 NOTE — Addendum Note (Signed)
Addended by: Maretta BeesMCGLASHAN, Yeraldin Litzenberger J on: 05/03/2017 12:01 PM   Modules accepted: Orders

## 2017-05-03 NOTE — Patient Instructions (Signed)
 First Trimester of Pregnancy The first trimester of pregnancy is from week 1 until the end of week 13 (months 1 through 3). A week after a sperm fertilizes an egg, the egg will implant on the wall of the uterus. This embryo will begin to develop into a baby. Genes from you and your partner will form the baby. The female genes will determine whether the baby will be a boy or a girl. At 6-8 weeks, the eyes and face will be formed, and the heartbeat can be seen on ultrasound. At the end of 12 weeks, all the baby's organs will be formed. Now that you are pregnant, you will want to do everything you can to have a healthy baby. Two of the most important things are to get good prenatal care and to follow your health care provider's instructions. Prenatal care is all the medical care you receive before the baby's birth. This care will help prevent, find, and treat any problems during the pregnancy and childbirth. Body changes during your first trimester Your body goes through many changes during pregnancy. The changes vary from woman to woman.  You may gain or lose a couple of pounds at first.  You may feel sick to your stomach (nauseous) and you may throw up (vomit). If the vomiting is uncontrollable, call your health care provider.  You may tire easily.  You may develop headaches that can be relieved by medicines. All medicines should be approved by your health care provider.  You may urinate more often. Painful urination may mean you have a bladder infection.  You may develop heartburn as a result of your pregnancy.  You may develop constipation because certain hormones are causing the muscles that push stool through your intestines to slow down.  You may develop hemorrhoids or swollen veins (varicose veins).  Your breasts may begin to grow larger and become tender. Your nipples may stick out more, and the tissue that surrounds them (areola) may become darker.  Your gums may bleed and may be  sensitive to brushing and flossing.  Dark spots or blotches (chloasma, mask of pregnancy) may develop on your face. This will likely fade after the baby is born.  Your menstrual periods will stop.  You may have a loss of appetite.  You may develop cravings for certain kinds of food.  You may have changes in your emotions from day to day, such as being excited to be pregnant or being concerned that something may go wrong with the pregnancy and baby.  You may have more vivid and strange dreams.  You may have changes in your hair. These can include thickening of your hair, rapid growth, and changes in texture. Some women also have hair loss during or after pregnancy, or hair that feels dry or thin. Your hair will most likely return to normal after your baby is born.  What to expect at prenatal visits During a routine prenatal visit:  You will be weighed to make sure you and the baby are growing normally.  Your blood pressure will be taken.  Your abdomen will be measured to track your baby's growth.  The fetal heartbeat will be listened to between weeks 10 and 14 of your pregnancy.  Test results from any previous visits will be discussed.  Your health care provider may ask you:  How you are feeling.  If you are feeling the baby move.  If you have had any abnormal symptoms, such as leaking fluid, bleeding, severe   headaches, or abdominal cramping.  If you are using any tobacco products, including cigarettes, chewing tobacco, and electronic cigarettes.  If you have any questions.  Other tests that may be performed during your first trimester include:  Blood tests to find your blood type and to check for the presence of any previous infections. The tests will also be used to check for low iron levels (anemia) and protein on red blood cells (Rh antibodies). Depending on your risk factors, or if you previously had diabetes during pregnancy, you may have tests to check for high blood  sugar that affects pregnant women (gestational diabetes).  Urine tests to check for infections, diabetes, or protein in the urine.  An ultrasound to confirm the proper growth and development of the baby.  Fetal screens for spinal cord problems (spina bifida) and Down syndrome.  HIV (human immunodeficiency virus) testing. Routine prenatal testing includes screening for HIV, unless you choose not to have this test.  You may need other tests to make sure you and the baby are doing well.  Follow these instructions at home: Medicines  Follow your health care provider's instructions regarding medicine use. Specific medicines may be either safe or unsafe to take during pregnancy.  Take a prenatal vitamin that contains at least 600 micrograms (mcg) of folic acid.  If you develop constipation, try taking a stool softener if your health care provider approves. Eating and drinking  Eat a balanced diet that includes fresh fruits and vegetables, whole grains, good sources of protein such as meat, eggs, or tofu, and low-fat dairy. Your health care provider will help you determine the amount of weight gain that is right for you.  Avoid raw meat and uncooked cheese. These carry germs that can cause birth defects in the baby.  Eating four or five small meals rather than three large meals a day may help relieve nausea and vomiting. If you start to feel nauseous, eating a few soda crackers can be helpful. Drinking liquids between meals, instead of during meals, also seems to help ease nausea and vomiting.  Limit foods that are high in fat and processed sugars, such as fried and sweet foods.  To prevent constipation: ? Eat foods that are high in fiber, such as fresh fruits and vegetables, whole grains, and beans. ? Drink enough fluid to keep your urine clear or pale yellow. Activity  Exercise only as directed by your health care provider. Most women can continue their usual exercise routine during  pregnancy. Try to exercise for 30 minutes at least 5 days a week. Exercising will help you: ? Control your weight. ? Stay in shape. ? Be prepared for labor and delivery.  Experiencing pain or cramping in the lower abdomen or lower back is a good sign that you should stop exercising. Check with your health care provider before continuing with normal exercises.  Try to avoid standing for long periods of time. Move your legs often if you must stand in one place for a long time.  Avoid heavy lifting.  Wear low-heeled shoes and practice good posture.  You may continue to have sex unless your health care provider tells you not to. Relieving pain and discomfort  Wear a good support bra to relieve breast tenderness.  Take warm sitz baths to soothe any pain or discomfort caused by hemorrhoids. Use hemorrhoid cream if your health care provider approves.  Rest with your legs elevated if you have leg cramps or low back pain.  If you   develop varicose veins in your legs, wear support hose. Elevate your feet for 15 minutes, 3-4 times a day. Limit salt in your diet. Prenatal care  Schedule your prenatal visits by the twelfth week of pregnancy. They are usually scheduled monthly at first, then more often in the last 2 months before delivery.  Write down your questions. Take them to your prenatal visits.  Keep all your prenatal visits as told by your health care provider. This is important. Safety  Wear your seat belt at all times when driving.  Make a list of emergency phone numbers, including numbers for family, friends, the hospital, and police and fire departments. General instructions  Ask your health care provider for a referral to a local prenatal education class. Begin classes no later than the beginning of month 6 of your pregnancy.  Ask for help if you have counseling or nutritional needs during pregnancy. Your health care provider can offer advice or refer you to specialists for help  with various needs.  Do not use hot tubs, steam rooms, or saunas.  Do not douche or use tampons or scented sanitary pads.  Do not cross your legs for long periods of time.  Avoid cat litter boxes and soil used by cats. These carry germs that can cause birth defects in the baby and possibly loss of the fetus by miscarriage or stillbirth.  Avoid all smoking, herbs, alcohol, and medicines not prescribed by your health care provider. Chemicals in these products affect the formation and growth of the baby.  Do not use any products that contain nicotine or tobacco, such as cigarettes and e-cigarettes. If you need help quitting, ask your health care provider. You may receive counseling support and other resources to help you quit.  Schedule a dentist appointment. At home, brush your teeth with a soft toothbrush and be gentle when you floss. Contact a health care provider if:  You have dizziness.  You have mild pelvic cramps, pelvic pressure, or nagging pain in the abdominal area.  You have persistent nausea, vomiting, or diarrhea.  You have a bad smelling vaginal discharge.  You have pain when you urinate.  You notice increased swelling in your face, hands, legs, or ankles.  You are exposed to fifth disease or chickenpox.  You are exposed to German measles (rubella) and have never had it. Get help right away if:  You have a fever.  You are leaking fluid from your vagina.  You have spotting or bleeding from your vagina.  You have severe abdominal cramping or pain.  You have rapid weight gain or loss.  You vomit blood or material that looks like coffee grounds.  You develop a severe headache.  You have shortness of breath.  You have any kind of trauma, such as from a fall or a car accident. Summary  The first trimester of pregnancy is from week 1 until the end of week 13 (months 1 through 3).  Your body goes through many changes during pregnancy. The changes vary from  woman to woman.  You will have routine prenatal visits. During those visits, your health care provider will examine you, discuss any test results you may have, and talk with you about how you are feeling. This information is not intended to replace advice given to you by your health care provider. Make sure you discuss any questions you have with your health care provider. Document Released: 09/12/2001 Document Revised: 08/30/2016 Document Reviewed: 08/30/2016 Elsevier Interactive Patient Education  2017   [employer]  Contraception Choices Contraception (birth control) is the use of any methods or devices to prevent pregnancy. Below are some methods to help avoid pregnancy. Hormonal methods  Contraceptive implant. This is a thin, plastic tube containing progesterone hormone. It does not contain estrogen hormone. Your health care provider inserts the tube in the inner part of the upper arm. The tube can remain in place for up to 3 years. After 3 years, the implant must be removed. The implant prevents the ovaries from releasing an egg (ovulation), thickens the cervical mucus to prevent sperm from entering the uterus, and thins the lining of the inside of the uterus.  Progesterone-only injections. These injections are given every 3 months by your health care provider to prevent pregnancy. This synthetic progesterone hormone stops the ovaries from releasing eggs. It also thickens cervical mucus and changes the uterine lining. This makes it harder for sperm to survive in the uterus.  Birth control pills. These pills contain estrogen and progesterone hormone. They work by preventing the ovaries from releasing eggs (ovulation). They also cause the cervical mucus to thicken, preventing the sperm from entering the uterus. Birth control pills are prescribed by a health care provider.Birth control pills can also be used to treat heavy periods.  Minipill. This type of birth control pill contains only the  progesterone hormone. They are taken every day of each month and must be prescribed by your health care provider.  Birth control patch. The patch contains hormones similar to those in birth control pills. It must be changed once a week and is prescribed by a health care provider.  Vaginal ring. The ring contains hormones similar to those in birth control pills. It is left in the vagina for 3 weeks, removed for 1 week, and then a new one is put back in place. The patient must be comfortable inserting and removing the ring from the vagina.A health care provider's prescription is necessary.  Emergency contraception. Emergency contraceptives prevent pregnancy after unprotected sexual intercourse. This pill can be taken right after sex or up to 5 days after unprotected sex. It is most effective the sooner you take the pills after having sexual intercourse. Most emergency contraceptive pills are available without a prescription. Check with your pharmacist. Do not use emergency contraception as your only form of birth control. Barrier methods  Female condom. This is a thin sheath (latex or rubber) that is worn over the penis during sexual intercourse. It can be used with spermicide to increase effectiveness.  Female condom. This is a soft, loose-fitting sheath that is put into the vagina before sexual intercourse.  Diaphragm. This is a soft, latex, dome-shaped barrier that must be fitted by a health care provider. It is inserted into the vagina, along with a spermicidal jelly. It is inserted before intercourse. The diaphragm should be left in the vagina for 6 to 8 hours after intercourse.  Cervical cap. This is a round, soft, latex or plastic cup that fits over the cervix and must be fitted by a health care provider. The cap can be left in place for up to 48 hours after intercourse.  Sponge. This is a soft, circular piece of polyurethane foam. The sponge has spermicide in it. It is inserted into the vagina  after wetting it and before sexual intercourse.  Spermicides. These are chemicals that kill or block sperm from entering the cervix and uterus. They come in the form of creams, jellies, suppositories, foam, or tablets. They do not   require a prescription. They are inserted into the vagina with an applicator before having sexual intercourse. The process must be repeated every time you have sexual intercourse. Intrauterine contraception  Intrauterine device (IUD). This is a T-shaped device that is put in a woman's uterus during a menstrual period to prevent pregnancy. There are 2 types: ? Copper IUD. This type of IUD is wrapped in copper wire and is placed inside the uterus. Copper makes the uterus and fallopian tubes produce a fluid that kills sperm. It can stay in place for 10 years. ? Hormone IUD. This type of IUD contains the hormone progestin (synthetic progesterone). The hormone thickens the cervical mucus and prevents sperm from entering the uterus, and it also thins the uterine lining to prevent implantation of a fertilized egg. The hormone can weaken or kill the sperm that get into the uterus. It can stay in place for 3-5 years, depending on which type of IUD is used. Permanent methods of contraception  Female tubal ligation. This is when the woman's fallopian tubes are surgically sealed, tied, or blocked to prevent the egg from traveling to the uterus.  Hysteroscopic sterilization. This involves placing a small coil or insert into each fallopian tube. Your doctor uses a technique called hysteroscopy to do the procedure. The device causes scar tissue to form. This results in permanent blockage of the fallopian tubes, so the sperm cannot fertilize the egg. It takes about 3 months after the procedure for the tubes to become blocked. You must use another form of birth control for these 3 months.  Female sterilization. This is when the female has the tubes that carry sperm tied off (vasectomy).This  blocks sperm from entering the vagina during sexual intercourse. After the procedure, the man can still ejaculate fluid (semen). Natural planning methods  Natural family planning. This is not having sexual intercourse or using a barrier method (condom, diaphragm, cervical cap) on days the woman could become pregnant.  Calendar method. This is keeping track of the length of each menstrual cycle and identifying when you are fertile.  Ovulation method. This is avoiding sexual intercourse during ovulation.  Symptothermal method. This is avoiding sexual intercourse during ovulation, using a thermometer and ovulation symptoms.  Post-ovulation method. This is timing sexual intercourse after you have ovulated. Regardless of which type or method of contraception you choose, it is important that you use condoms to protect against the transmission of sexually transmitted infections (STIs). Talk with your health care provider about which form of contraception is most appropriate for you. This information is not intended to replace advice given to you by your health care provider. Make sure you discuss any questions you have with your health care provider. Document Released: 09/18/2005 Document Revised: 02/24/2016 Document Reviewed: 03/13/2013 Elsevier Interactive Patient Education  2017 [employer]   Breastfeeding Deciding to breastfeed is one of the best choices you can make for you and your baby. A change in hormones during pregnancy causes your breast tissue to grow and increases the number and size of your milk ducts. These hormones also allow proteins, sugars, and fats from your blood supply to make breast milk in your milk-producing glands. Hormones prevent breast milk from being released before your baby is born as well as prompt milk flow after birth. Once breastfeeding has begun, thoughts of your baby, as well as his or her sucking or crying, can stimulate the release of milk from your  milk-producing glands. Benefits of breastfeeding For Your Baby  Your   first milk (colostrum) helps your baby's digestive system function better.  There are antibodies in your milk that help your baby fight off infections.  Your baby has a lower incidence of asthma, allergies, and sudden infant death syndrome.  The nutrients in breast milk are better for your baby than infant formulas and are designed uniquely for your baby's needs.  Breast milk improves your baby's brain development.  Your baby is less likely to develop other conditions, such as childhood obesity, asthma, or type 2 diabetes mellitus.  For You  Breastfeeding helps to create a very special bond between you and your baby.  Breastfeeding is convenient. Breast milk is always available at the correct temperature and costs nothing.  Breastfeeding helps to burn calories and helps you lose the weight gained during pregnancy.  Breastfeeding makes your uterus contract to its prepregnancy size faster and slows bleeding (lochia) after you give birth.  Breastfeeding helps to lower your risk of developing type 2 diabetes mellitus, osteoporosis, and breast or ovarian cancer later in life.  Signs that your baby is hungry Early Signs of Hunger  Increased alertness or activity.  Stretching.  Movement of the head from side to side.  Movement of the head and opening of the mouth when the corner of the mouth or cheek is stroked (rooting).  Increased sucking sounds, smacking lips, cooing, sighing, or squeaking.  Hand-to-mouth movements.  Increased sucking of fingers or hands.  Late Signs of Hunger  Fussing.  Intermittent crying.  Extreme Signs of Hunger Signs of extreme hunger will require calming and consoling before your baby will be able to breastfeed successfully. Do not wait for the following signs of extreme hunger to occur before you initiate breastfeeding:  Restlessness.  A loud, strong  cry.  Screaming.  Breastfeeding basics Breastfeeding Initiation  Find a comfortable place to sit or lie down, with your neck and back well supported.  Place a pillow or rolled up blanket under your baby to bring him or her to the level of your breast (if you are seated). Nursing pillows are specially designed to help support your arms and your baby while you breastfeed.  Make sure that your baby's abdomen is facing your abdomen.  Gently massage your breast. With your fingertips, massage from your chest wall toward your nipple in a circular motion. This encourages milk flow. You may need to continue this action during the feeding if your milk flows slowly.  Support your breast with 4 fingers underneath and your thumb above your nipple. Make sure your fingers are well away from your nipple and your baby's mouth.  Stroke your baby's lips gently with your finger or nipple.  When your baby's mouth is open wide enough, quickly bring your baby to your breast, placing your entire nipple and as much of the colored area around your nipple (areola) as possible into your baby's mouth. ? More areola should be visible above your baby's upper lip than below the lower lip. ? Your baby's tongue should be between his or her lower gum and your breast.  Ensure that your baby's mouth is correctly positioned around your nipple (latched). Your baby's lips should create a seal on your breast and be turned out (everted).  It is common for your baby to suck about 2-3 minutes in order to start the flow of breast milk.  Latching Teaching your baby how to latch on to your breast properly is very important. An improper latch can cause nipple pain and decreased   milk supply for you and poor weight gain in your baby. Also, if your baby is not latched onto your nipple properly, he or she may swallow some air during feeding. This can make your baby fussy. Burping your baby when you switch breasts during the feeding can  help to get rid of the air. However, teaching your baby to latch on properly is still the best way to prevent fussiness from swallowing air while breastfeeding. Signs that your baby has successfully latched on to your nipple:  Silent tugging or silent sucking, without causing you pain.  Swallowing heard between every 3-4 sucks.  Muscle movement above and in front of his or her ears while sucking.  Signs that your baby has not successfully latched on to nipple:  Sucking sounds or smacking sounds from your baby while breastfeeding.  Nipple pain.  If you think your baby has not latched on correctly, slip your finger into the corner of your baby's mouth to break the suction and place it between your baby's gums. Attempt breastfeeding initiation again. Signs of Successful Breastfeeding Signs from your baby:  A gradual decrease in the number of sucks or complete cessation of sucking.  Falling asleep.  Relaxation of his or her body.  Retention of a small amount of milk in his or her mouth.  Letting go of your breast by himself or herself.  Signs from you:  Breasts that have increased in firmness, weight, and size 1-3 hours after feeding.  Breasts that are softer immediately after breastfeeding.  Increased milk volume, as well as a change in milk consistency and color by the fifth day of breastfeeding.  Nipples that are not sore, cracked, or bleeding.  Signs That Your Baby is Getting Enough Milk  Wetting at least 1-2 diapers during the first 24 hours after birth.  Wetting at least 5-6 diapers every 24 hours for the first week after birth. The urine should be clear or pale yellow by 5 days after birth.  Wetting 6-8 diapers every 24 hours as your baby continues to grow and develop.  At least 3 stools in a 24-hour period by age 5 days. The stool should be soft and yellow.  At least 3 stools in a 24-hour period by age 7 days. The stool should be seedy and yellow.  No loss of  weight greater than 10% of birth weight during the first 3 days of age.  Average weight gain of 4-7 ounces (113-198 g) per week after age 4 days.  Consistent daily weight gain by age 5 days, without weight loss after the age of 2 weeks.  After a feeding, your baby may spit up a small amount. This is common. Breastfeeding frequency and duration Frequent feeding will help you make more milk and can prevent sore nipples and breast engorgement. Breastfeed when you feel the need to reduce the fullness of your breasts or when your baby shows signs of hunger. This is called "breastfeeding on demand." Avoid introducing a pacifier to your baby while you are working to establish breastfeeding (the first 4-6 weeks after your baby is born). After this time you may choose to use a pacifier. Research has shown that pacifier use during the first year of a baby's life decreases the risk of sudden infant death syndrome (SIDS). Allow your baby to feed on each breast as long as he or she wants. Breastfeed until your baby is finished feeding. When your baby unlatches or falls asleep while feeding from the first   breast, offer the second breast. Because newborns are often sleepy in the first few weeks of life, you may need to awaken your baby to get him or her to feed. Breastfeeding times will vary from baby to baby. However, the following rules can serve as a guide to help you ensure that your baby is properly fed:  Newborns (babies 4 weeks of age or younger) may breastfeed every 1-3 hours.  Newborns should not go longer than 3 hours during the day or 5 hours during the night without breastfeeding.  You should breastfeed your baby a minimum of 8 times in a 24-hour period until you begin to introduce solid foods to your baby at around 6 months of age.  Breast milk pumping Pumping and storing breast milk allows you to ensure that your baby is exclusively fed your breast milk, even at times when you are unable to  breastfeed. This is especially important if you are going back to work while you are still breastfeeding or when you are not able to be present during feedings. Your lactation consultant can give you guidelines on how long it is safe to store breast milk. A breast pump is a machine that allows you to pump milk from your breast into a sterile bottle. The pumped breast milk can then be stored in a refrigerator or freezer. Some breast pumps are operated by hand, while others use electricity. Ask your lactation consultant which type will work best for you. Breast pumps can be purchased, but some hospitals and breastfeeding support groups lease breast pumps on a monthly basis. A lactation consultant can teach you how to hand express breast milk, if you prefer not to use a pump. Caring for your breasts while you breastfeed Nipples can become dry, cracked, and sore while breastfeeding. The following recommendations can help keep your breasts moisturized and healthy:  Avoid using soap on your nipples.  Wear a supportive bra. Although not required, special nursing bras and tank tops are designed to allow access to your breasts for breastfeeding without taking off your entire bra or top. Avoid wearing underwire-style bras or extremely tight bras.  Air dry your nipples for 3-4minutes after each feeding.  Use only cotton bra pads to absorb leaked breast milk. Leaking of breast milk between feedings is normal.  Use lanolin on your nipples after breastfeeding. Lanolin helps to maintain your skin's normal moisture barrier. If you use pure lanolin, you do not need to wash it off before feeding your baby again. Pure lanolin is not toxic to your baby. You may also hand express a few drops of breast milk and gently massage that milk into your nipples and allow the milk to air dry.  In the first few weeks after giving birth, some women experience extremely full breasts (engorgement). Engorgement can make your breasts  feel heavy, warm, and tender to the touch. Engorgement peaks within 3-5 days after you give birth. The following recommendations can help ease engorgement:  Completely empty your breasts while breastfeeding or pumping. You may want to start by applying warm, moist heat (in the shower or with warm water-soaked hand towels) just before feeding or pumping. This increases circulation and helps the milk flow. If your baby does not completely empty your breasts while breastfeeding, pump any extra milk after he or she is finished.  Wear a snug bra (nursing or regular) or tank top for 1-2 days to signal your body to slightly decrease milk production.  Apply ice packs to   your breasts, unless this is too uncomfortable for you.  Make sure that your baby is latched on and positioned properly while breastfeeding.  If engorgement persists after 48 hours of following these recommendations, contact your health care provider or a lactation consultant. Overall health care recommendations while breastfeeding  Eat healthy foods. Alternate between meals and snacks, eating 3 of each per day. Because what you eat affects your breast milk, some of the foods may make your baby more irritable than usual. Avoid eating these foods if you are sure that they are negatively affecting your baby.  Drink milk, fruit juice, and water to satisfy your thirst (about 10 glasses a day).  Rest often, relax, and continue to take your prenatal vitamins to prevent fatigue, stress, and anemia.  Continue breast self-awareness checks.  Avoid chewing and smoking tobacco. Chemicals from cigarettes that pass into breast milk and exposure to secondhand smoke may harm your baby.  Avoid alcohol and drug use, including marijuana. Some medicines that may be harmful to your baby can pass through breast milk. It is important to ask your health care provider before taking any medicine, including all over-the-counter and prescription medicine as well  as vitamin and herbal supplements. It is possible to become pregnant while breastfeeding. If birth control is desired, ask your health care provider about options that will be safe for your baby. Contact a health care provider if:  You feel like you want to stop breastfeeding or have become frustrated with breastfeeding.  You have painful breasts or nipples.  Your nipples are cracked or bleeding.  Your breasts are red, tender, or warm.  You have a swollen area on either breast.  You have a fever or chills.  You have nausea or vomiting.  You have drainage other than breast milk from your nipples.  Your breasts do not become full before feedings by the fifth day after you give birth.  You feel sad and depressed.  Your baby is too sleepy to eat well.  Your baby is having trouble sleeping.  Your baby is wetting less than 3 diapers in a 24-hour period.  Your baby has less than 3 stools in a 24-hour period.  Your baby's skin or the white part of his or her eyes becomes yellow.  Your baby is not gaining weight by 5 days of age. Get help right away if:  Your baby is overly tired (lethargic) and does not want to wake up and feed.  Your baby develops an unexplained fever. This information is not intended to replace advice given to you by your health care provider. Make sure you discuss any questions you have with your health care provider. Document Released: 09/18/2005 Document Revised: 03/01/2016 Document Reviewed: 03/12/2013 Elsevier Interactive Patient Education  2017 [employer]  

## 2017-05-03 NOTE — Progress Notes (Signed)
  Subjective:    Whitney Fernandez is a G1P0 544w6d being seen today for her first obstetrical visit.  Her obstetrical history is significant for first pregnancy. Patient does intend to breast feed. Pregnancy history fully reviewed.  Patient reports no complaints.  Vitals:   05/03/17 1021  BP: 120/79  Pulse: 83  Temp: 97.9 F (36.6 C)  Weight: 140 lb (63.5 kg)    HISTORY: OB History  Gravida Para Term Preterm AB Living  1 0          SAB TAB Ectopic Multiple Live Births               # Outcome Date GA Lbr Len/2nd Weight Sex Delivery Anes PTL Lv  1 Current              History reviewed. No pertinent past medical history. Past Surgical History:  Procedure Laterality Date  . TONSILLECTOMY     Family History  Problem Relation Age of Onset  . Miscarriages / Stillbirths Paternal Aunt   . Cancer Maternal Grandmother   . Varicose Veins Maternal Grandmother      Exam    Uterus:   10 weeks  Pelvic Exam:    Perineum: No Hemorrhoids, Normal Perineum   Vulva: normal   Vagina:  normal mucosa, normal discharge   pH:    Cervix: nulliparous appearance   Adnexa: normal adnexa and no mass, fullness, tenderness   Bony Pelvis: gynecoid  System: Breast:  normal appearance, no masses or tenderness   Skin: normal coloration and turgor, no rashes    Neurologic: oriented, no focal deficits   Extremities: normal strength, tone, and muscle mass   HEENT extra ocular movement intact   Mouth/Teeth mucous membranes moist, pharynx normal without lesions and dental hygiene good   Neck supple and no masses   Cardiovascular: regular rate and rhythm   Respiratory:  chest clear, no wheezing, crepitations, rhonchi, normal symmetric air entry   Abdomen: soft, non-tender; bowel sounds normal; no masses,  no organomegaly   Urinary:       Assessment:    Pregnancy: G1P0 Patient Active Problem List   Diagnosis Date Noted  . Supervision of normal first pregnancy, antepartum 05/03/2017         Plan:     Initial labs drawn. Prenatal vitamins. Problem list reviewed and updated. Genetic Screening discussed First Screen: ordered.  Ultrasound discussed; fetal survey: requested.  Follow up in 4 weeks. 50% of 30 min visit spent on counseling and coordination of care.     Whitney Fernandez 05/03/2017

## 2017-05-04 LAB — CYTOLOGY - PAP
ADEQUACY: ABSENT
Diagnosis: NEGATIVE

## 2017-05-04 LAB — GC/CHLAMYDIA PROBE AMP (~~LOC~~) NOT AT ARMC
CHLAMYDIA, DNA PROBE: NEGATIVE
NEISSERIA GONORRHEA: NEGATIVE

## 2017-05-07 ENCOUNTER — Other Ambulatory Visit: Payer: Self-pay | Admitting: Obstetrics and Gynecology

## 2017-05-07 ENCOUNTER — Telehealth: Payer: Self-pay

## 2017-05-07 DIAGNOSIS — Z3400 Encounter for supervision of normal first pregnancy, unspecified trimester: Secondary | ICD-10-CM

## 2017-05-07 LAB — CULTURE, OB URINE

## 2017-05-07 LAB — URINE CULTURE, OB REFLEX

## 2017-05-07 MED ORDER — CEPHALEXIN 500 MG PO CAPS: 500.0000 mg | ORAL_CAPSULE | Freq: Four times a day (QID) | ORAL | 0 refills | Status: AC

## 2017-05-07 MED ORDER — CEPHALEXIN 500 MG PO CAPS: 500.0000 mg | ORAL_CAPSULE | Freq: Four times a day (QID) | ORAL | 0 refills | Status: DC

## 2017-05-07 NOTE — Telephone Encounter (Signed)
-----   Message from Catalina AntiguaPeggy Constant, MD sent at 05/07/2017 11:46 AM EDT ----- Please inform patient of UTI. Rx for keflex e-prescribed  [employer]

## 2017-05-07 NOTE — Telephone Encounter (Signed)
Patient notified. [place] advised us [place] and wants RX transferred to [facility] on [address].

## 2017-05-09 LAB — OBSTETRIC PANEL, INCLUDING HIV
ANTIBODY SCREEN: NEGATIVE
BASOS: 0 %
Basophils Absolute: 0 10*3/uL (ref 0.0–0.2)
EOS (ABSOLUTE): 0 10*3/uL (ref 0.0–0.4)
EOS: 1 %
HEMATOCRIT: 41 % (ref 34.0–46.6)
HEMOGLOBIN: 13.3 g/dL (ref 11.1–15.9)
HIV SCREEN 4TH GENERATION: NONREACTIVE
Hepatitis B Surface Ag: NEGATIVE
IMMATURE GRANS (ABS): 0 10*3/uL (ref 0.0–0.1)
Immature Granulocytes: 0 %
LYMPHS ABS: 1.8 10*3/uL (ref 0.7–3.1)
LYMPHS: 29 %
MCH: 27.9 pg (ref 26.6–33.0)
MCHC: 32.4 g/dL (ref 31.5–35.7)
MCV: 86 fL (ref 79–97)
MONOS ABS: 0.3 10*3/uL (ref 0.1–0.9)
Monocytes: 6 %
NEUTROS ABS: 3.9 10*3/uL (ref 1.4–7.0)
Neutrophils: 64 %
Platelets: 388 10*3/uL — ABNORMAL HIGH (ref 150–379)
RBC: 4.77 x10E6/uL (ref 3.77–5.28)
RDW: 14.2 % (ref 12.3–15.4)
RH TYPE: POSITIVE
RPR Ser Ql: NONREACTIVE
Rubella Antibodies, IGG: 5.04 index (ref >0.99)
WBC: 6.1 10*3/uL (ref 3.4–10.8)

## 2017-05-09 LAB — HEMOGLOBINOPATHY EVALUATION
HGB C: 0 %
HGB S: 0 %
HGB VARIANT: 0 %
Hemoglobin A2 Quantitation: 2.3 % (ref 1.8–3.2)
Hemoglobin F Quantitation: 0 % (ref 0.0–2.0)
Hgb A: 97.7 % (ref 96.4–98.8)

## 2017-05-09 LAB — VARICELLA ZOSTER ANTIBODY, IGG: VARICELLA: 2283 {index} (ref >165)

## 2017-05-11 LAB — CYSTIC FIBROSIS MUTATION 97: Interpretation: NOT DETECTED

## 2017-05-15 LAB — SMN1 COPY NUMBER ANALYSIS (SMA CARRIER SCREENING)

## 2017-05-16 ENCOUNTER — Encounter (HOSPITAL_COMMUNITY): Payer: Self-pay

## 2017-05-16 ENCOUNTER — Other Ambulatory Visit (HOSPITAL_COMMUNITY): Payer: Medicaid Other

## 2017-05-16 ENCOUNTER — Ambulatory Visit (HOSPITAL_COMMUNITY): Payer: Medicaid Other

## 2017-05-31 ENCOUNTER — Encounter: Payer: Medicaid Other | Admitting: Obstetrics and Gynecology

## 2018-02-15 ENCOUNTER — Encounter (HOSPITAL_COMMUNITY): Payer: Self-pay

## 2018-04-06 IMAGING — US US OB COMP LESS 14 WK
1 series · 15 of 28 positions shown · non-contrast
Comparison: None for this gestation

CLINICAL DATA: Abdominal pain affecting pregnancy

EXAM:
OBSTETRIC <14 WK US AND TRANSVAGINAL OB US
TECHNIQUE: Both transabdominal and transvaginal ultrasound examinations were
performed for complete evaluation of the gestation as well as the
maternal uterus, adnexal regions, and pelvic cul-de-sac.
Transvaginal technique was performed to assess early pregnancy.

[Series 1: us ob comp less 14 wk · 15 of 71 slices shown]
[im 1/71]
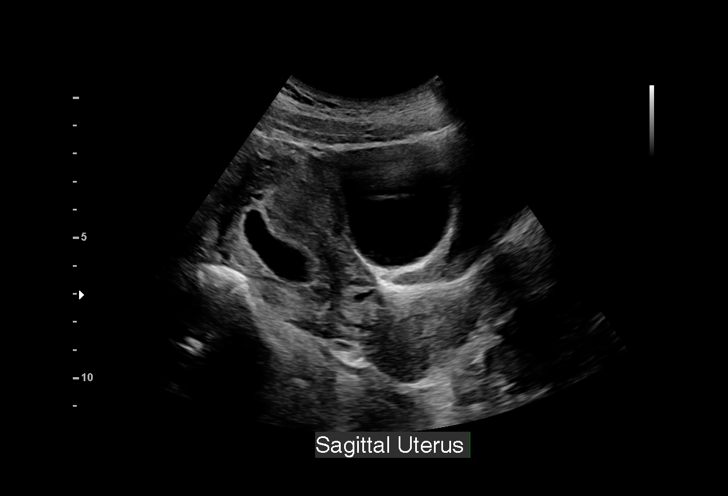
[im 6/71]
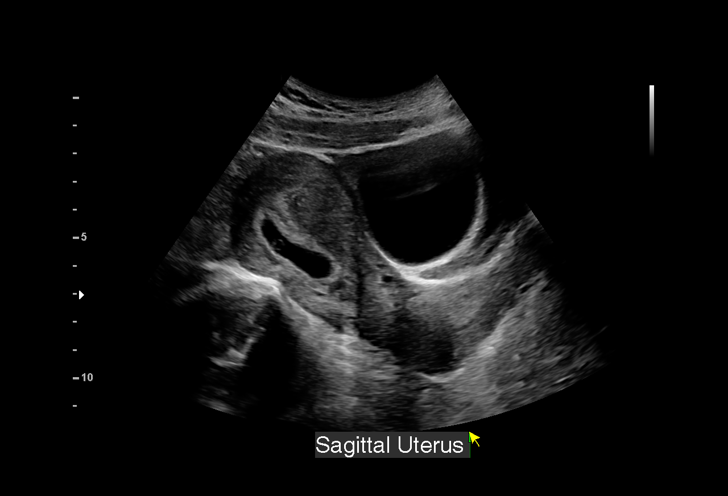
[im 11/71]
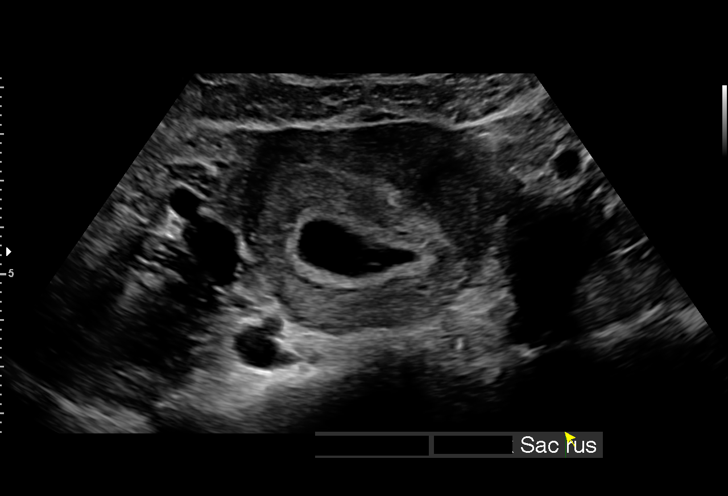
[im 16/71]
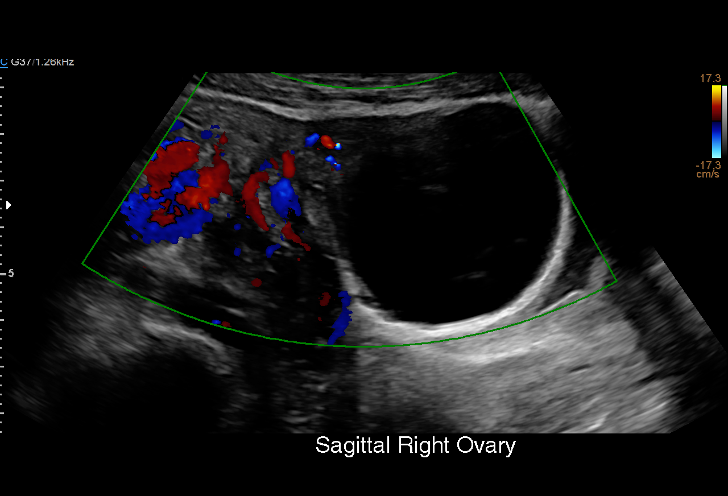
[im 21/71]
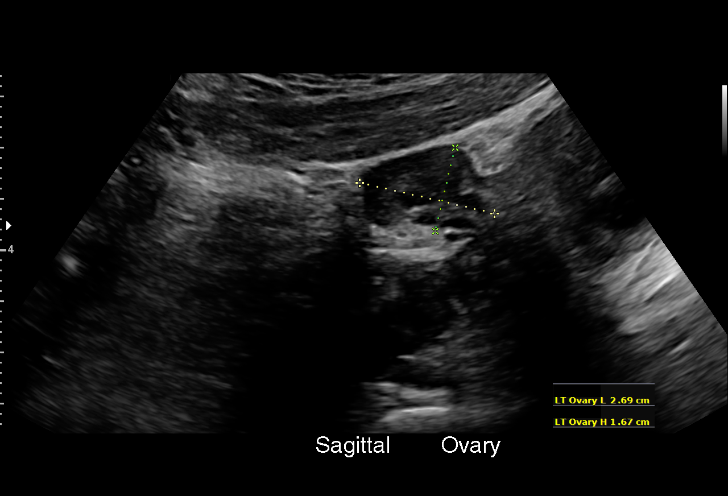
[im 26/71]
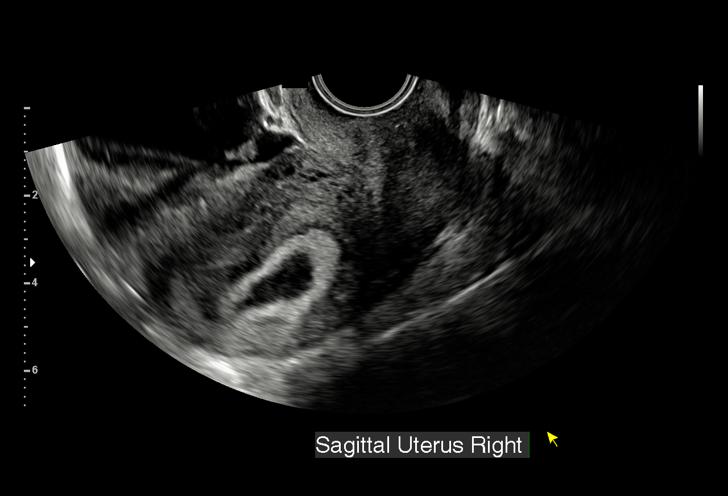
[im 32/71]
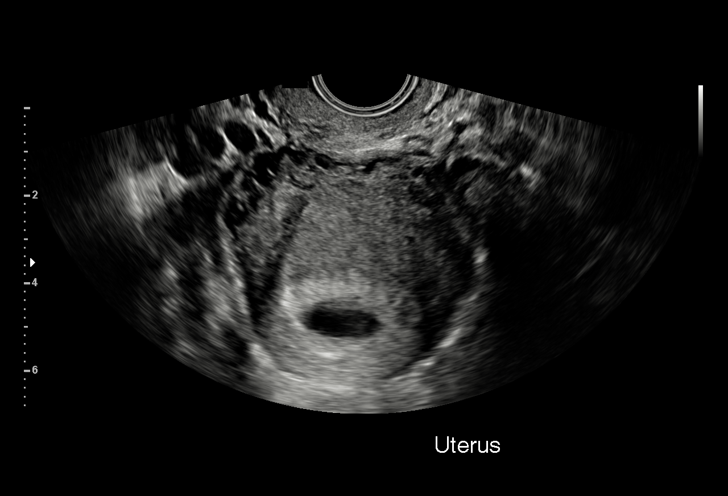
[im 37/71]
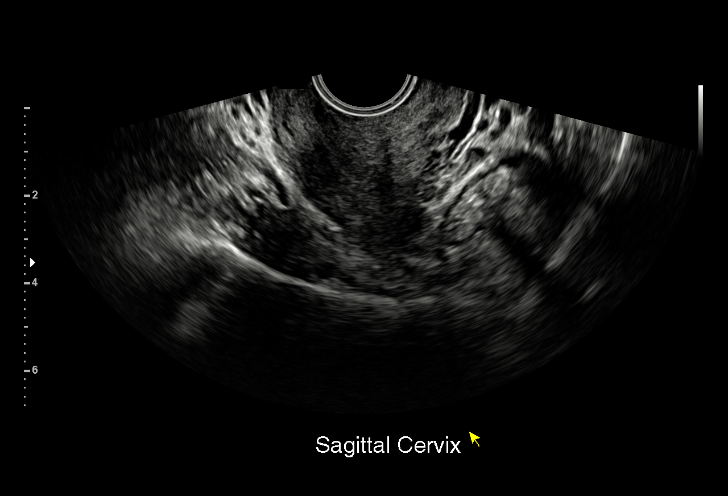
[im 39/71]
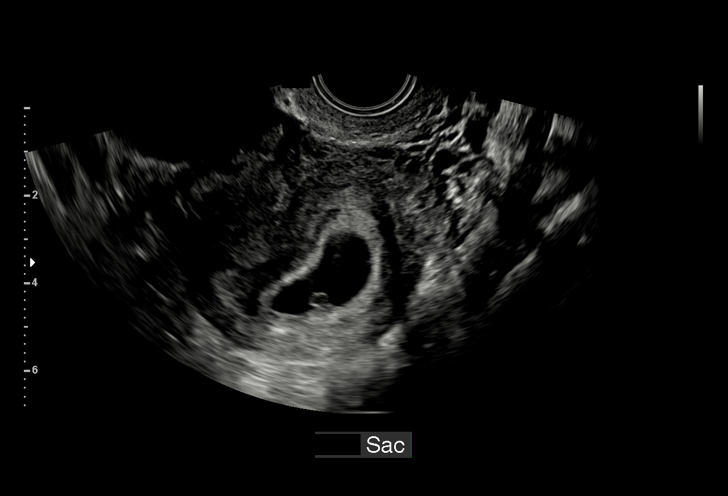
[im 45/71]
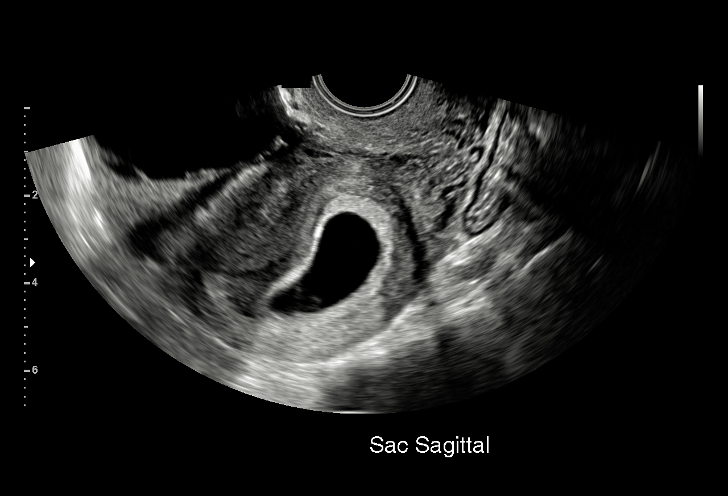
[im 50/71]
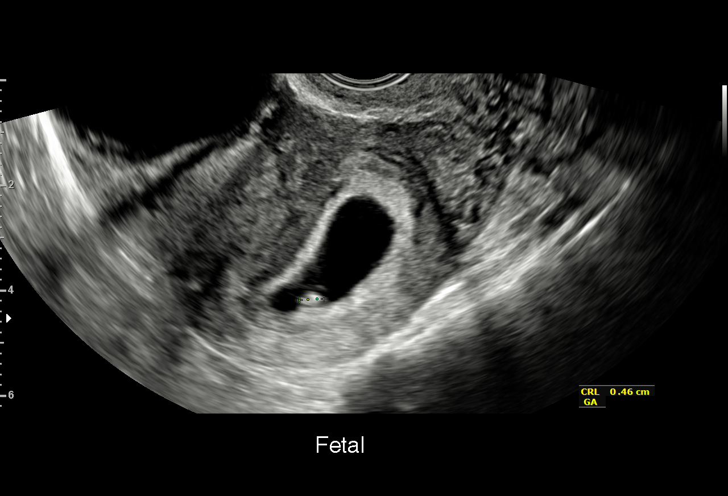
[im 55/71]
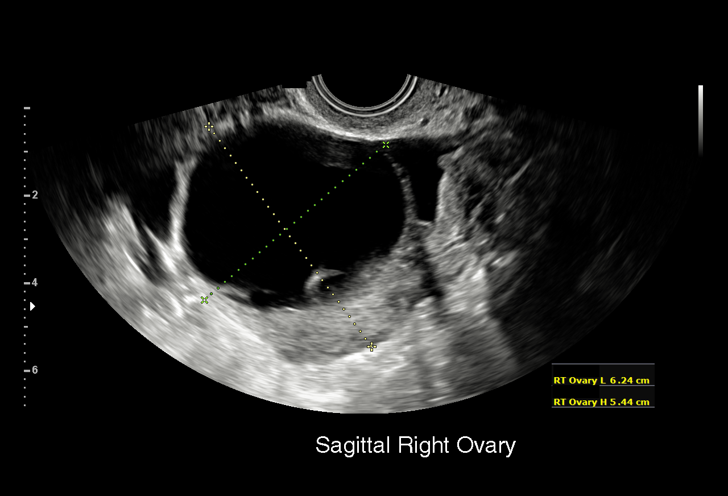
[im 60/71]
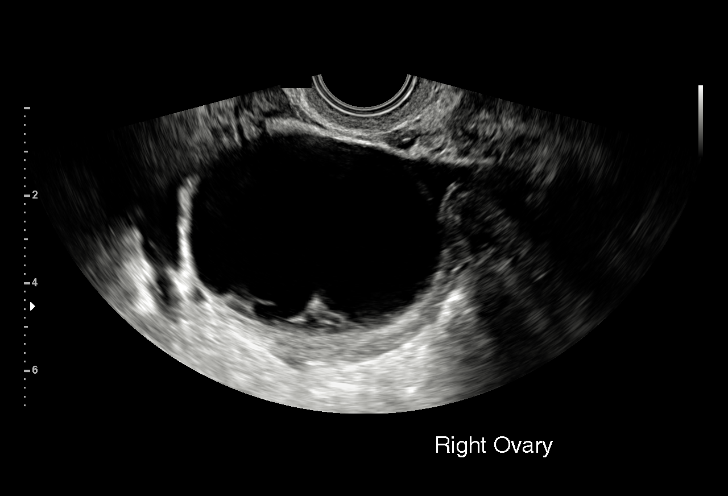
[im 65/71]
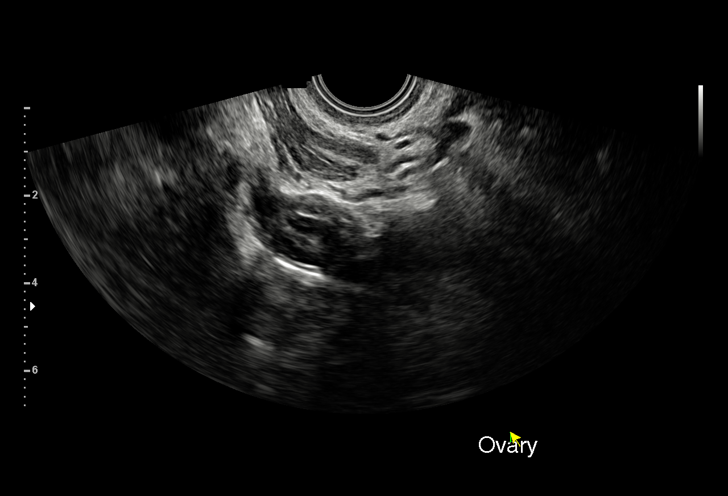
[im 71/71]
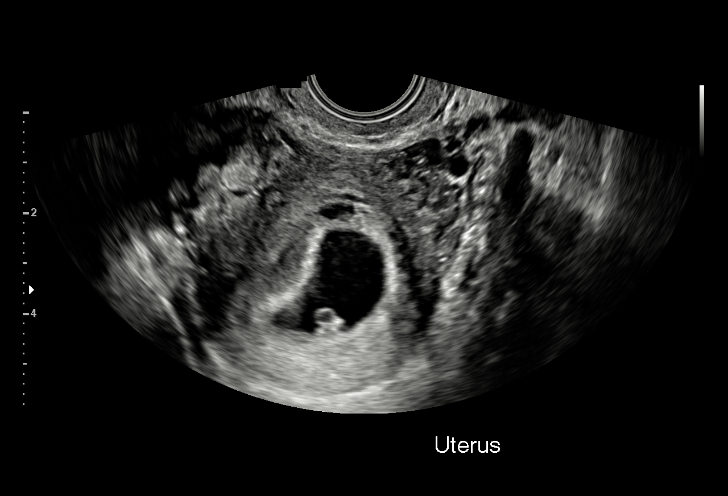

[15 of 28 positions shown; findings below may reference images not displayed]

FINDINGS: Intrauterine gestational sac: Present

Yolk sac:  Present

Embryo:  Present

Cardiac Activity: Present

Heart Rate: 136  bpm

CRL:  4.6  mm   6 w   1 d                  US EDC: 11/19/2015

Subchorionic hemorrhage:  None visualized.

Maternal uterus/adnexae: LEFT ovary normal size and morphology,
x 1.7 x 2.5 cm.

RIGHT ovary enlarged at 6.2 x 5.4 x 4.9 cm, containing a large
septated cyst. 4.8 x 4.4 x 5.8 cm.

Trace free pelvic fluid.

No additional adnexal masses.
IMPRESSION: Single live intrauterine gestation at 6 weeks 1 day EGA by
crown-rump length.

Large septated cyst RIGHT ovary 5.8 cm greatest size.
